# Patient Record
Sex: Female | Born: 1990 | Race: White | Hispanic: No | Marital: Married | Smoking: Never smoker
Health system: Southern US, Community
[De-identification: ages and names within clinical notes are randomized; demographics above are authoritative.]

---
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
## 2012-11-04 ENCOUNTER — Encounter (INDEPENDENT_AMBULATORY_CARE_PROVIDER_SITE_OTHER): Payer: Self-pay | Admitting: *Deleted

## 2012-11-12 ENCOUNTER — Ambulatory Visit (INDEPENDENT_AMBULATORY_CARE_PROVIDER_SITE_OTHER): Payer: 59 | Admitting: Internal Medicine

## 2013-06-25 ENCOUNTER — Other Ambulatory Visit: Payer: Self-pay | Admitting: Obstetrics & Gynecology

## 2013-06-25 MED ORDER — DESOGESTREL-ETHINYL ESTRADIOL 0.15-0.02/0.01 MG (21/5) PO TABS: 1.0000 | ORAL_TABLET | Freq: Every day | ORAL | Status: DC

## 2013-08-10 ENCOUNTER — Encounter (HOSPITAL_COMMUNITY): Payer: Self-pay | Admitting: Emergency Medicine

## 2013-08-10 ENCOUNTER — Emergency Department (HOSPITAL_COMMUNITY)
Admission: EM | Admit: 2013-08-10 | Discharge: 2013-08-10 | Disposition: A | Discharge status: Home / Self Care | Payer: 59 | Attending: Emergency Medicine | Admitting: Emergency Medicine

## 2013-08-10 DIAGNOSIS — Z3202 Encounter for pregnancy test, result negative: Secondary | ICD-10-CM | POA: Insufficient documentation

## 2013-08-10 DIAGNOSIS — Z79899 Other long term (current) drug therapy: Secondary | ICD-10-CM | POA: Insufficient documentation

## 2013-08-10 DIAGNOSIS — K5289 Other specified noninfective gastroenteritis and colitis: Secondary | ICD-10-CM | POA: Insufficient documentation

## 2013-08-10 DIAGNOSIS — K529 Noninfective gastroenteritis and colitis, unspecified: Secondary | ICD-10-CM

## 2013-08-10 LAB — COMPREHENSIVE METABOLIC PANEL
ALBUMIN: 4.1 g/dL (ref 3.5–5.2)
ALK PHOS: 55 U/L (ref 39–117)
ALT: 12 U/L (ref 0–35)
AST: 17 U/L (ref 0–37)
BILIRUBIN TOTAL: 0.4 mg/dL (ref 0.3–1.2)
BUN: 14 mg/dL (ref 6–23)
CHLORIDE: 99 meq/L (ref 96–112)
CO2: 24 mEq/L (ref 19–32)
Calcium: 9.2 mg/dL (ref 8.4–10.5)
Creatinine, Ser: 0.77 mg/dL (ref 0.50–1.10)
GFR calc Af Amer: >90 mL/min (ref >90)
GFR calc non Af Amer: >90 mL/min (ref >90)
Glucose, Bld: 111 mg/dL — ABNORMAL HIGH (ref 70–99)
POTASSIUM: 3.8 meq/L (ref 3.7–5.3)
Sodium: 136 mEq/L — ABNORMAL LOW (ref 137–147)
Total Protein: 8.1 g/dL (ref 6.0–8.3)

## 2013-08-10 LAB — URINALYSIS, ROUTINE W REFLEX MICROSCOPIC
Bilirubin Urine: NEGATIVE
Glucose, UA: NEGATIVE mg/dL
KETONES UR: 15 mg/dL — AB
Leukocytes, UA: NEGATIVE
Nitrite: NEGATIVE
PH: 6 (ref 5.0–8.0)
Specific Gravity, Urine: >1.03 — ABNORMAL HIGH (ref 1.005–1.030)
Urobilinogen, UA: 0.2 mg/dL (ref 0.0–1.0)

## 2013-08-10 LAB — CBC WITH DIFFERENTIAL/PLATELET
BASOS ABS: 0 10*3/uL (ref 0.0–0.1)
BASOS PCT: 0 % (ref 0–1)
Eosinophils Absolute: 0.1 10*3/uL (ref 0.0–0.7)
Eosinophils Relative: 1 % (ref 0–5)
HCT: 39.9 % (ref 36.0–46.0)
Hemoglobin: 13.6 g/dL (ref 12.0–15.0)
Lymphocytes Relative: 8 % — ABNORMAL LOW (ref 12–46)
Lymphs Abs: 0.8 10*3/uL (ref 0.7–4.0)
MCH: 29 pg (ref 26.0–34.0)
MCHC: 34.1 g/dL (ref 30.0–36.0)
MCV: 85.1 fL (ref 78.0–100.0)
Monocytes Absolute: 0.6 10*3/uL (ref 0.1–1.0)
Monocytes Relative: 6 % (ref 3–12)
NEUTROS PCT: 85 % — AB (ref 43–77)
Neutro Abs: 7.9 10*3/uL — ABNORMAL HIGH (ref 1.7–7.7)
Platelets: 246 10*3/uL (ref 150–400)
RBC: 4.69 MIL/uL (ref 3.87–5.11)
RDW: 12.9 % (ref 11.5–15.5)
WBC: 9.3 10*3/uL (ref 4.0–10.5)

## 2013-08-10 LAB — URINE MICROSCOPIC-ADD ON

## 2013-08-10 LAB — PREGNANCY, URINE: Preg Test, Ur: NEGATIVE

## 2013-08-10 MED ORDER — KETOROLAC TROMETHAMINE 30 MG/ML IJ SOLN
30.0000 mg | Freq: Once | INTRAMUSCULAR | Status: AC
  Administered 2013-08-10: 30 mg via INTRAVENOUS
  Filled 2013-08-10: qty 1

## 2013-08-10 MED ORDER — ONDANSETRON HCL 4 MG/2ML IJ SOLN
4.0000 mg | Freq: Once | INTRAMUSCULAR | Status: AC
  Administered 2013-08-10: 4 mg via INTRAVENOUS
  Filled 2013-08-10: qty 2

## 2013-08-10 MED ORDER — SODIUM CHLORIDE 0.9 % IV BOLUS (SEPSIS)
1000.0000 mL | Freq: Once | INTRAVENOUS | Status: AC
  Administered 2013-08-10: 1000 mL via INTRAVENOUS

## 2013-08-10 MED ORDER — ONDANSETRON 8 MG PO TBDP: ORAL_TABLET | ORAL | Status: DC

## 2013-08-10 NOTE — Discharge Instructions (Signed)
Zofran as needed for nausea.  Clear liquids as tolerated for the next 24 hours, then slowly advance your diet.  Return to the emergency department if he develops severe abdominal pain, bloody stool, or any other new and concerning symptoms.   Viral Gastroenteritis Viral gastroenteritis is also known as stomach flu. This condition affects the stomach and intestinal tract. It can cause sudden diarrhea and vomiting. The illness typically lasts 3 to 8 days. Most people develop an immune response that eventually gets rid of the virus. While this natural response develops, the virus can make you quite ill. CAUSES  Many different viruses can cause gastroenteritis, such as rotavirus or noroviruses. You can catch one of these viruses by consuming contaminated food or water. You may also catch a virus by sharing utensils or other personal items with an infected person or by touching a contaminated surface. SYMPTOMS  The most common symptoms are diarrhea and vomiting. These problems can cause a severe loss of body fluids (dehydration) and a body salt (electrolyte) imbalance. Other symptoms may include:  Fever.  Headache.  Fatigue.  Abdominal pain. DIAGNOSIS  Your caregiver can usually diagnose viral gastroenteritis based on your symptoms and a physical exam. A stool sample may also be taken to test for the presence of viruses or other infections. TREATMENT  This illness typically goes away on its own. Treatments are aimed at rehydration. The most serious cases of viral gastroenteritis involve vomiting so severely that you are not able to keep fluids down. In these cases, fluids must be given through an intravenous line (IV). HOME CARE INSTRUCTIONS   Drink enough fluids to keep your urine clear or pale yellow. Drink small amounts of fluids frequently and increase the amounts as tolerated.  Ask your caregiver for specific rehydration instructions.  Avoid:  Foods high in  sugar.  Alcohol.  Carbonated drinks.  Tobacco.  Juice.  Caffeine drinks.  Extremely hot or cold fluids.  Fatty, greasy foods.  Too much intake of anything at one time.  Dairy products until 24 to 48 hours after diarrhea stops.  You may consume probiotics. Probiotics are active cultures of beneficial bacteria. They may lessen the amount and number of diarrheal stools in adults. Probiotics can be found in yogurt with active cultures and in supplements.  Wash your hands well to avoid spreading the virus.  Only take over-the-counter or prescription medicines for pain, discomfort, or fever as directed by your caregiver. Do not give aspirin to children. Antidiarrheal medicines are not recommended.  Ask your caregiver if you should continue to take your regular prescribed and over-the-counter medicines.  Keep all follow-up appointments as directed by your caregiver. SEEK IMMEDIATE MEDICAL CARE IF:   You are unable to keep fluids down.  You do not urinate at least once every 6 to 8 hours.  You develop shortness of breath.  You notice blood in your stool or vomit. This may look like coffee grounds.  You have abdominal pain that increases or is concentrated in one small area (localized).  You have persistent vomiting or diarrhea.  You have a fever.  The patient is a child younger than 3 months, and he or she has a fever.  The patient is a child older than 3 months, and he or she has a fever and persistent symptoms.  The patient is a child older than 3 months, and he or she has a fever and symptoms suddenly get worse.  The patient is a baby, and he or  she has no tears when crying. MAKE SURE YOU:   Understand these instructions.  Will watch your condition.  Will get help right away if you are not doing well or get worse. Document Released: 03/06/2005 Document Revised: 05/29/2011 Document Reviewed: 12/21/2010 [facility] Patient Information 2014 Startup.

## 2013-08-10 NOTE — ED Notes (Signed)
Pt states she is having abd pains with vomiting and diarrhea since Friday

## 2013-08-10 NOTE — ED Provider Notes (Signed)
CSN: [id number]     Arrival date & time 08/10/13  0407 History   First MD Initiated Contact with Patient 08/10/13 [phone]     Chief Complaint  Patient presents with  . Abdominal Pain  . Emesis     (Consider location/radiation/quality/duration/timing/severity/associated sxs/prior Treatment) HPI Comments: Patient is a 23 year old female otherwise healthy who presents with complaints of nausea, vomiting, diarrhea, and intermittent abdominal cramping for the past 3 days. She states she is having hard time keeping fluids and solids down. She denies any bloody vomit or stool. She denies any burning with urination. She denies any fever. Her last menstrual period is current and denies the possibility of being pregnant.  Patient is a 23 y.o. female presenting with abdominal pain and vomiting. The history is provided by the patient.  Abdominal Pain Pain location:  Generalized Pain quality: cramping   Pain severity:  Moderate Onset quality:  Gradual Duration:  3 days Timing:  Intermittent Progression:  Worsening Chronicity:  New Relieved by:  Nothing Worsened by:  Nothing tried Ineffective treatments:  None tried Associated symptoms: vomiting   Emesis Associated symptoms: abdominal pain     History reviewed. No pertinent past medical history. History reviewed. No pertinent past surgical history. No family history on file. History  Substance Use Topics  . Smoking status: Never Smoker   . Smokeless tobacco: Not on file  . Alcohol Use: No   OB History   Grav Para Term Preterm Abortions TAB SAB Ect Mult Living                 Review of Systems  Gastrointestinal: Positive for vomiting and abdominal pain.  All other systems reviewed and are negative.     Allergies  Review of patient's allergies indicates no known allergies.  Home Medications   Prior to Admission medications   Medication Sig Start Date End Date Taking? Authorizing Provider  loperamide (IMODIUM) 1 MG/5ML solution  Take by mouth as needed for diarrhea or loose stools.   Yes Historical Provider, MD  desogestrel-ethinyl estradiol (KARIVA,AZURETTE,MIRCETTE) 0.15-0.02/0.01 MG (21/5) tablet Take 1 tablet by mouth daily. 06/25/13   Florian Buff, MD   BP 118/73  Pulse 70  Temp(Src) 97.9 F (36.6 C) (Oral)  Resp 20  Ht 5\' 3"  (1.6 m)  Wt 117 lb (53.071 kg)  BMI 20.73 kg/m2  SpO2 99%  LMP 08/07/2013 Physical Exam  Nursing note and vitals reviewed. Constitutional: She is oriented to person, place, and time. She appears well-developed and well-nourished. No distress.  HENT:  Head: Normocephalic and atraumatic.  Neck: Normal range of motion. Neck supple.  Cardiovascular: Normal rate and regular rhythm.  Exam reveals no gallop and no friction rub.   No murmur heard. Pulmonary/Chest: Effort normal and breath sounds normal. No respiratory distress. She has no wheezes.  Abdominal: Soft. Bowel sounds are normal. She exhibits no distension. There is tenderness.  There is mild generalized tenderness to palpation of the abdomen. There is no focal tenderness and no rebound and no guarding.  Musculoskeletal: Normal range of motion.  Neurological: She is alert and oriented to person, place, and time.  Skin: Skin is warm and dry. She is not diaphoretic.    ED Course  Procedures (including critical care time) Labs Review Labs Reviewed - No data to display  Imaging Review No results found.   EKG Interpretation None      MDM   Final diagnoses:  None    The patient's presentation, physical examination, and  workup are all consistent with a viral gastroenteritis. She is feeling better with IV fluids, Zofran, and Toradol. Her abdomen is benign. There is no white count to suggest severe infection and electrolytes are essentially unremarkable. She will be discharged with Zofran and instructions to return as needed if her symptoms substantially worsen or change.    Veryl Speak, MD 08/10/13 [phone]

## 2013-10-07 ENCOUNTER — Ambulatory Visit (INDEPENDENT_AMBULATORY_CARE_PROVIDER_SITE_OTHER): Payer: 59 | Admitting: Obstetrics & Gynecology

## 2013-10-07 ENCOUNTER — Encounter: Payer: Self-pay | Admitting: Obstetrics & Gynecology

## 2013-10-07 VITALS — BP 110/70 | Ht 63.0 in | Wt 118.4 lb

## 2013-10-07 DIAGNOSIS — Z309 Encounter for contraceptive management, unspecified: Secondary | ICD-10-CM

## 2013-10-22 ENCOUNTER — Encounter (INDEPENDENT_AMBULATORY_CARE_PROVIDER_SITE_OTHER): Payer: Self-pay | Admitting: *Deleted

## 2013-10-27 ENCOUNTER — Other Ambulatory Visit (INDEPENDENT_AMBULATORY_CARE_PROVIDER_SITE_OTHER): Payer: Self-pay | Admitting: Internal Medicine

## 2013-10-27 DIAGNOSIS — R160 Hepatomegaly, not elsewhere classified: Secondary | ICD-10-CM

## 2013-10-29 ENCOUNTER — Ambulatory Visit (HOSPITAL_COMMUNITY)
Admission: RE | Admit: 2013-10-29 | Discharge: 2013-10-29 | Disposition: A | Discharge status: Home / Self Care | Payer: 59 | Source: Ambulatory Visit | Attending: Internal Medicine | Admitting: Internal Medicine

## 2013-10-29 DIAGNOSIS — R160 Hepatomegaly, not elsewhere classified: Secondary | ICD-10-CM | POA: Diagnosis not present

## 2013-12-16 NOTE — Progress Notes (Signed)
Patient ID: Gina Anderson, female   DOB: 19-Dec-1990, 23 y.o.   MRN: [id number] Having difficulty with this ocp with her menses Will change to a 30 mic desogestrel ocp Will follow up if having a problem

## 2014-02-20 ENCOUNTER — Ambulatory Visit (HOSPITAL_COMMUNITY)
Admission: RE | Admit: 2014-02-20 | Discharge: 2014-02-20 | Disposition: A | Discharge status: Home / Self Care | Payer: 59 | Source: Ambulatory Visit | Attending: Internal Medicine | Admitting: Internal Medicine

## 2014-02-20 DIAGNOSIS — R1013 Epigastric pain: Secondary | ICD-10-CM | POA: Diagnosis not present

## 2014-02-20 DIAGNOSIS — R933 Abnormal findings on diagnostic imaging of other parts of digestive tract: Secondary | ICD-10-CM | POA: Diagnosis not present

## 2014-02-20 DIAGNOSIS — R109 Unspecified abdominal pain: Secondary | ICD-10-CM

## 2014-02-20 LAB — CBC
HCT: 38.7 % (ref 36.0–46.0)
HEMOGLOBIN: 13.6 g/dL (ref 12.0–15.0)
MCH: 29.6 pg (ref 26.0–34.0)
MCHC: 35.1 g/dL (ref 30.0–36.0)
MCV: 84.3 fL (ref 78.0–100.0)
Platelets: 198 10*3/uL (ref 150–400)
RBC: 4.59 MIL/uL (ref 3.87–5.11)
RDW: 12.7 % (ref 11.5–15.5)
WBC: 13.1 10*3/uL — ABNORMAL HIGH (ref 4.0–10.5)

## 2014-02-20 LAB — DIFFERENTIAL
BASOS PCT: 0 % (ref 0–1)
Basophils Absolute: 0 10*3/uL (ref 0.0–0.1)
Eosinophils Absolute: 0.1 10*3/uL (ref 0.0–0.7)
Eosinophils Relative: 1 % (ref 0–5)
Lymphocytes Relative: 10 % — ABNORMAL LOW (ref 12–46)
Lymphs Abs: 1.3 10*3/uL (ref 0.7–4.0)
MONOS PCT: 4 % (ref 3–12)
Monocytes Absolute: 0.5 10*3/uL (ref 0.1–1.0)
NEUTROS ABS: 11.2 10*3/uL — AB (ref 1.7–7.7)
Neutrophils Relative %: 85 % — ABNORMAL HIGH (ref 43–77)

## 2014-02-20 LAB — HEPATIC FUNCTION PANEL
ALT: 13 U/L (ref 0–35)
AST: 16 U/L (ref 0–37)
Albumin: 4.6 g/dL (ref 3.5–5.2)
Alkaline Phosphatase: 42 U/L (ref 39–117)
Bilirubin, Direct: <0.2 mg/dL (ref 0.0–0.3)
TOTAL PROTEIN: 8.5 g/dL — AB (ref 6.0–8.3)
Total Bilirubin: 0.2 mg/dL — ABNORMAL LOW (ref 0.3–1.2)

## 2014-02-20 LAB — AMYLASE: Amylase: 55 U/L (ref 0–105)

## 2014-02-20 MED ORDER — IOHEXOL 300 MG/ML  SOLN
100.0000 mL | Freq: Once | INTRAMUSCULAR | Status: AC | PRN
  Administered 2014-02-20: 100 mL via INTRAVENOUS

## 2014-02-20 NOTE — Progress Notes (Signed)
Presenting complaint; Severe epigastric and pain across lower chest.  Subjective; Gina Anderson is 23 year old Caucasian female whom I have seen in the past for abdominal and mild splenomegaly. Her mother who works in [place] requested me to see her. Patient has been seen in emergency room on 2 prior occasions and workup is negative. She was fine when she went to bed last week. She woke up around 7 AM with pain across her upper abdomen. Everson pain has been getting worse. She describes his pain as a constant pain but it increases in intensity and may last for a few minutes. She has noted nausea but no vomiting. She denies shortness breath or cough. She has felt warm. She denies diarrhea melena or rectal bleeding. She also does not have urinary symptoms. She took one dose of Aleve this morning but without symptomatic improvement. Between these spells she has good appetite and she denies weight loss. She denies heartburn. Objective Patient appears to be in pain. Temp is 99.6. Face is flushed. Conjunctiva was pink. Sclerae nonicteric. No neck masses or thyromegaly noted. Cardiac exam with regular rhythm normal S1 and S2. No murmur or gallop noted. Lungs are clear to auscultation. Abdomen is symmetrical bowel sounds are normal. On palpation abdomen is soft. She has mild midepigastric tenderness below xiphisternum. No organomegaly or masses.  Assessment; Acute onset of bilateral lower chest and epigastric pain. She has had 5 or 6 episodes previously and workup is negative. Examination is unremarkable except low-grade fever and epigastric tenderness. Prior ultrasound exams have been negative for cholelithiasis. Need to rule out pancreatitis as well as mediastinal disease or adenopathy. Symptoms are not typical of GERD.  Plan; CBC, LFTs and serum amylase. Chest and abdominopelvic CT with contrast while she is having symptoms as the yield may be higher than menses asymptomatic. Percocet 5/325 one tablet by  mouth every 4 when necessary. Patient advised to go to emergency room if pain is not controlled with Percocet or if she develops vomiting or fever greater than 101.

## 2014-02-23 ENCOUNTER — Telehealth (INDEPENDENT_AMBULATORY_CARE_PROVIDER_SITE_OTHER): Payer: Self-pay | Admitting: *Deleted

## 2014-02-23 NOTE — Telephone Encounter (Signed)
Lavella Lemons, mother, would like to speak with Dr. Laural Golden. The return phone number is (work) [phone] or (cell) [phone]. Mother refused to say what the call was about. When asked for the return phone number, mother was very disrespectful. Ulani had a CT done on 02/20/14.

## 2014-02-24 NOTE — Telephone Encounter (Signed)
Dr.Rehman has talked with the patient , and mother. He has also talked with Dr.Daniel. Per Dr.Rehman he will be tagging information to be sent to [place]. Forwarded to [employer] as Juluis Rainier.

## 2014-02-24 NOTE — Telephone Encounter (Signed)
Notes faxed to Dr Quillian Quince

## 2014-02-25 ENCOUNTER — Ambulatory Visit (HOSPITAL_COMMUNITY)
Admission: RE | Admit: 2014-02-25 | Discharge: 2014-02-25 | Disposition: A | Discharge status: Home / Self Care | Payer: 59 | Source: Ambulatory Visit | Attending: Family Medicine | Admitting: Family Medicine

## 2014-02-25 DIAGNOSIS — I313 Pericardial effusion (noninflammatory): Secondary | ICD-10-CM | POA: Insufficient documentation

## 2014-02-25 DIAGNOSIS — I319 Disease of pericardium, unspecified: Secondary | ICD-10-CM

## 2014-02-25 NOTE — Progress Notes (Signed)
  Echocardiogram 2D Echocardiogram has been performed.  Darlina Sicilian M 02/25/2014, 1:27 PM

## 2014-04-16 ENCOUNTER — Ambulatory Visit (INDEPENDENT_AMBULATORY_CARE_PROVIDER_SITE_OTHER): Payer: 59 | Admitting: Cardiology

## 2014-04-16 ENCOUNTER — Encounter: Payer: Self-pay | Admitting: Cardiology

## 2014-04-16 VITALS — BP 126/84 | HR 84 | Ht 63.0 in | Wt 121.4 lb

## 2014-04-16 DIAGNOSIS — R079 Chest pain, unspecified: Secondary | ICD-10-CM

## 2014-04-16 DIAGNOSIS — R1013 Epigastric pain: Secondary | ICD-10-CM

## 2014-04-16 NOTE — Progress Notes (Signed)
Cardiology Office Note   Date:  04/16/2014   ID:  Gina Anderson, DOB February 10, 1991, MRN [id number]  PCP:  Gar Ponto, MD  Cardiologist:   Darlin Coco, MD   No chief complaint on file.     History of Present Illness: Gina Anderson is a 24 y.o. female who presents for evaluation of recent epigastric and chest discomfort.  The patient is a Ship broker at [employer].  She has a history of intermittent upper abdominal discomfort which has occurred off and on for several years.  She has been followed for this by Dr. Quillian Quince and has also seen Dr. Laural Golden, a gastroenterologist in [place].  On December 3 she had another episode of sudden severe upper abdominal discomfort.  An abdominal CT scan showed a slight pericardial effusion.  She had a subsequent echocardiogram on 02/25/14 which did not show any significant pericardial effusion.  There was a questionable scant effusion.  There was no evidence of hemodynamic compromise.  There were no valve lesions.  Since then the patient has been feeling well.  The patient's mother and sister were also present during today's exam.  They point out that normally the episodes of unexplained abdominal discomfort occur on weekends or on vacations. Blood work was obtained at the time of her initial presentation on December 3.  She had an initially slightly elevated white count which was normal on repeat.  She did not have a sedimentation rate or C-reactive protein drawn.  She comes in now for further follow-up of the questionable pericardial effusion.  She feels well now with no symptoms of chest discomfort or abdominal discomfort or shortness of breath.  He does not recall any pleuritic component to the previous epigastric discomfort.  Did have a low-grade temperature.    History reviewed. No pertinent past medical history.  History reviewed. No pertinent past surgical history.   Current Outpatient Prescriptions  Medication Sig Dispense Refill    . desogestrel-ethinyl estradiol (KARIVA,AZURETTE,MIRCETTE) 0.15-0.02/0.01 MG (21/5) tablet Take 1 tablet by mouth daily. 1 Package 11   No current facility-administered medications for this visit.    Allergies:   Review of patient's allergies indicates no known allergies.    Social History:  The patient  reports that she has never smoked. She does not have any smokeless tobacco history on file. She reports that she does not drink alcohol or use illicit drugs.   Family History:  The patient's family history includes Diabetes in her paternal grandfather and paternal grandmother; Heart Problems in her paternal grandfather; Lung cancer in her maternal grandfather.    ROS:  Please see the history of present illness.   Otherwise, review of systems are positive for none.   All other systems are reviewed and negative.    PHYSICAL EXAM: VS:  BP 126/84 mmHg  Pulse 84  Ht 5\' 3"  (1.6 m)  Wt 121 lb 6.4 oz (55.067 kg)  BMI 21.51 kg/m2 , BMI Body mass index is 21.51 kg/(m^2). GEN: Well nourished, well developed, in no acute distress HEENT: normal Neck: no JVD, carotid bruits, or masses Cardiac: RRR; no murmurs, rubs, or gallops,no edema  Respiratory:  clear to auscultation bilaterally, normal work of breathing GI: soft, nontender, nondistended, + BS MS: no deformity or atrophy Skin: warm and dry, no rash Neuro:  Strength and sensation are intact Psych: euthymic mood, full affect   EKG:  EKG is ordered today. The ekg ordered today demonstrates normal sinus rhythm.  Borderline rightward axis.  No ST segment changes to suggest pericarditis.   Recent Labs: 08/10/2013: BUN 14; Creatinine 0.77; Potassium 3.8; Sodium 136* 02/20/2014: ALT 13; Hemoglobin 13.6; Platelets 198    Lipid Panel No results found for: CHOL, TRIG, HDL, CHOLHDL, VLDL, LDLCALC, LDLDIRECT    Wt Readings from Last 3 Encounters:  04/16/14 121 lb 6.4 oz (55.067 kg)  10/07/13 118 lb 6.4 oz (53.706 kg)  08/10/13 117 lb  (53.071 kg)      Other studies Reviewed: Additional studies/ records that were reviewed today include: Results of recent CT scan of chest and abdomen.    ASSESSMENT AND PLAN:  1.  History of intermittent severe upper abdominal discomfort occurring off and on over the past several years, followed by GI and by Dr. Quillian Quince. 2.  Possible recent acute pericarditis in association with possible viral illness on December 3.  This was manifested by a tiny pericardial effusion.  Apparently no pericardial rub was heard by examining physicians.  Her initial white count was mildly elevated but this is a nonspecific finding.  No acute phase reactants such as sedimentation rate or C-reactive protein are available.  Echocardiogram on December 9 showed scant if any pericardial fluid.  Disposition: No further treatment indicated at this point.  If she has a recurrent of these symptoms we would like to examine her and listening closely for rub and also obtain acute phase reactants.   Current medicines are reviewed at length with the patient today.  The patient does not have concerns regarding medicines.  The following changes have been made:  no change  Labs/ tests ordered today include: 1   Orders Placed This Encounter  Procedures  . EKG 12-Lead     Disposition:   Recheck on a when necessary basis.  Many thanks for the opportunity to see this pleasant woman with you.   Signed, Darlin Coco, MD  04/16/2014 6:17 PM    [facility] [address], [place], [place]  [address] Phone: [phone]; Fax: [phone]

## 2014-04-16 NOTE — Patient Instructions (Signed)
Your physician recommends that you continue on your current medications as directed. Please refer to the Current Medication list given to you today.  Follow up as needed  

## 2014-04-29 ENCOUNTER — Other Ambulatory Visit: Payer: Self-pay | Admitting: Obstetrics & Gynecology

## 2015-04-26 ENCOUNTER — Other Ambulatory Visit: Payer: Self-pay | Admitting: Obstetrics & Gynecology

## 2015-09-04 IMAGING — CT CT ABD-PELV W/ CM
2 of 4 series · 13 of 36 positions shown, 16 images · IV contrast (Omnipaque 300)
Comparison: Abdominal ultrasound performed 10/29/2013

CLINICAL DATA: Acute onset of epigastric abdominal pain, radiating
from the right to the left, for the past day. Initial encounter.

EXAM:
CT CHEST, ABDOMEN, AND PELVIS WITH CONTRAST
TECHNIQUE: Multidetector CT imaging of the chest, abdomen and pelvis was
performed following the standard protocol during bolus
administration of intravenous contrast.
CONTRAST:  100mL OMNIPAQUE IOHEXOL 300 MG/ML  SOLN

[Series 3: cap with 5.0 b40f · axial · 0.74mm/px · z∈[+399,+964]mm · 10 of 129 slices shown, 13 images]
[im 8/129  mediastinal]
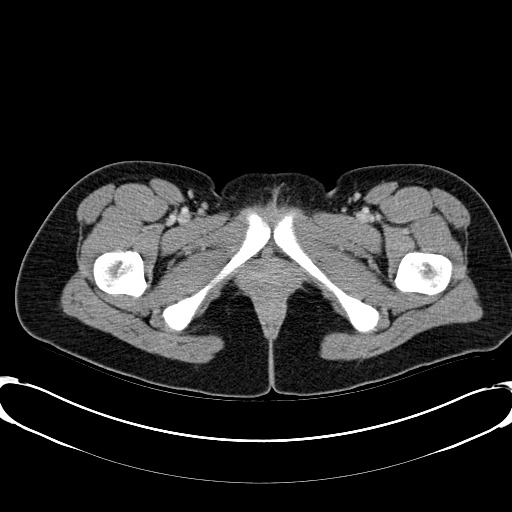
[im 8/129  lung]
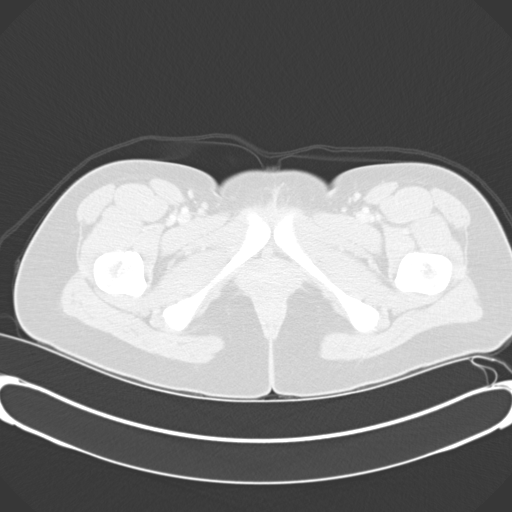
[im 22/129  lung]
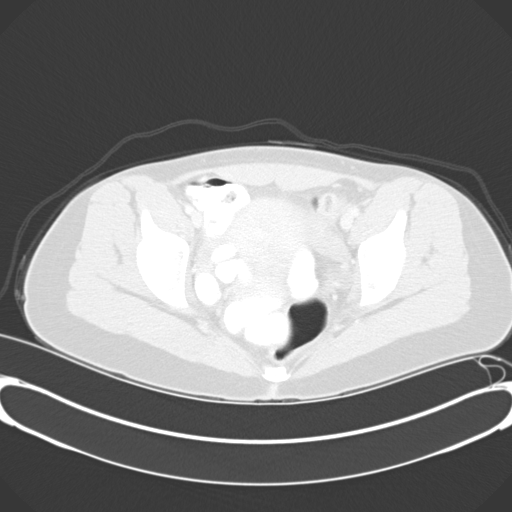
[im 36/129  lung]
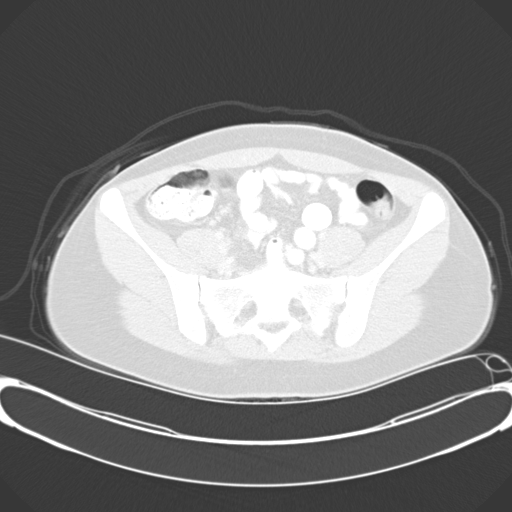
[im 43/129  lung]
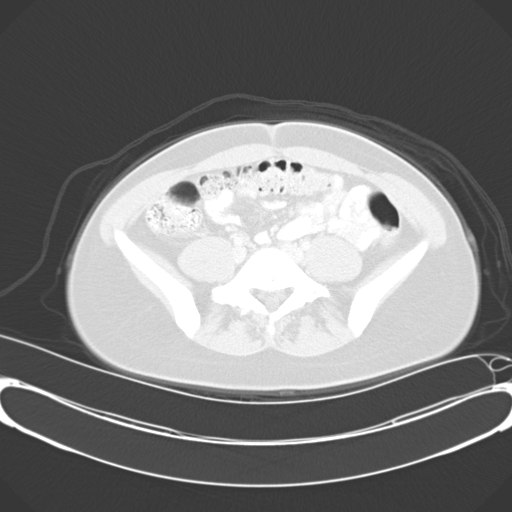
[im 57/129  mediastinal]
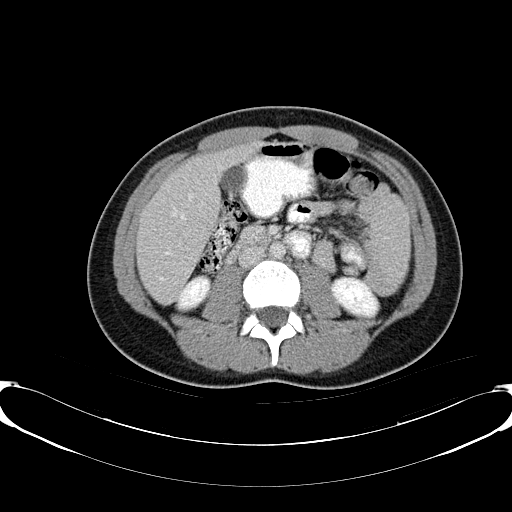
[im 57/129  lung]
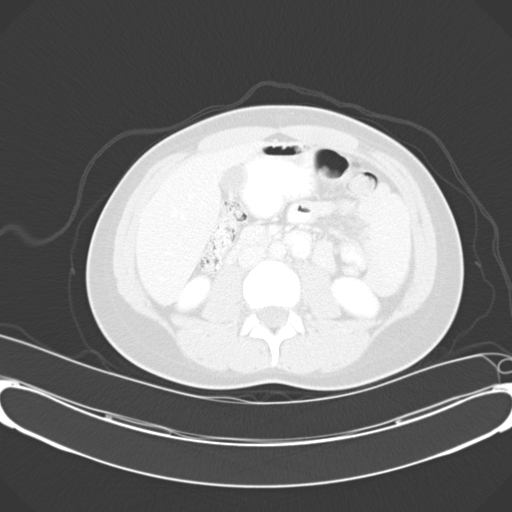
[im 72/129  lung]
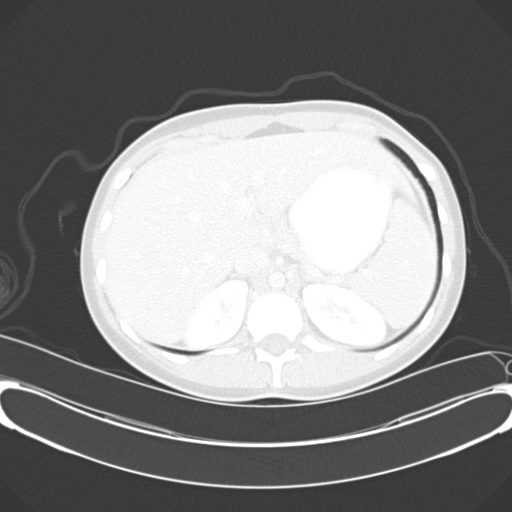
[im 86/129  lung]
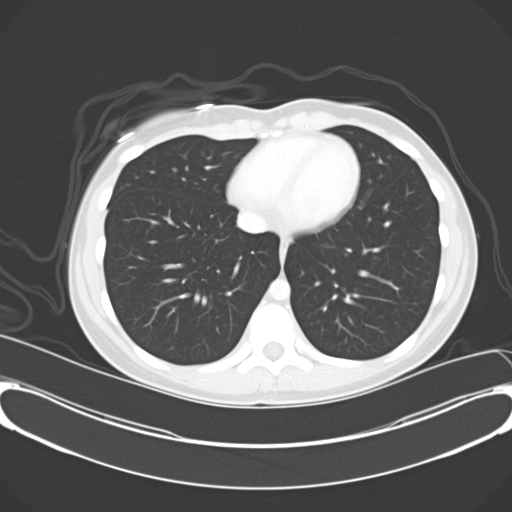
[im 93/129  lung]
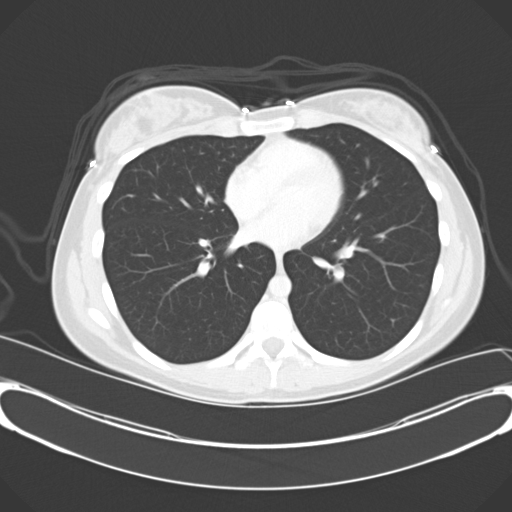
[im 107/129  mediastinal]
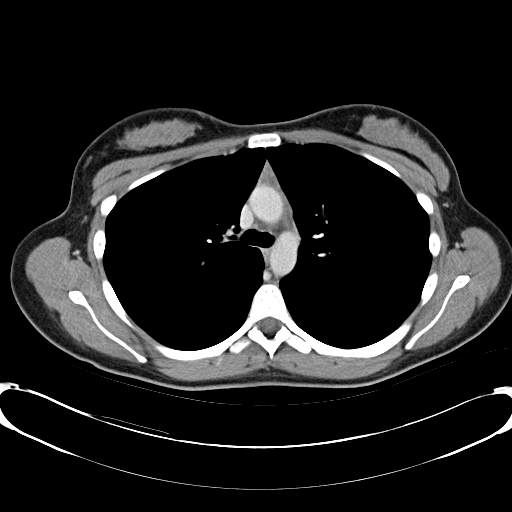
[im 107/129  lung]
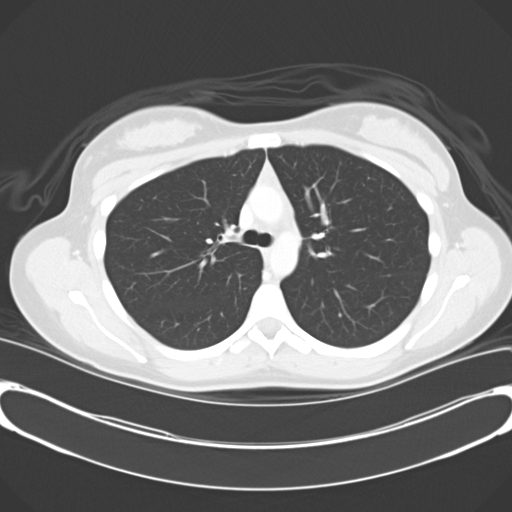
[im 121/129  lung]
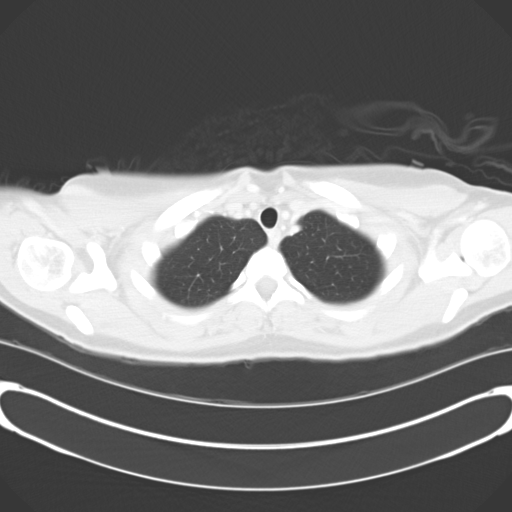

[Series 5: mpr cor post contrast 3.0mm · coronal · 0.74mm/px · 3 of 75 slices shown]
[im 15/75  lung]
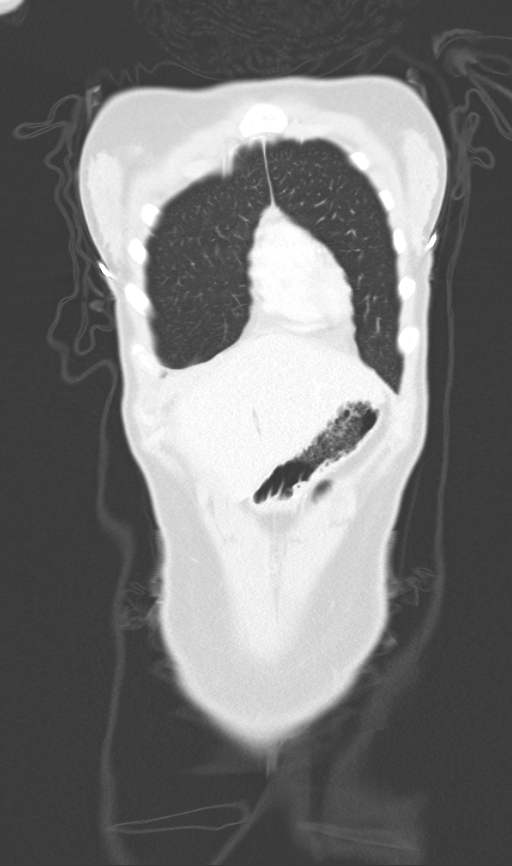
[im 30/75  lung]
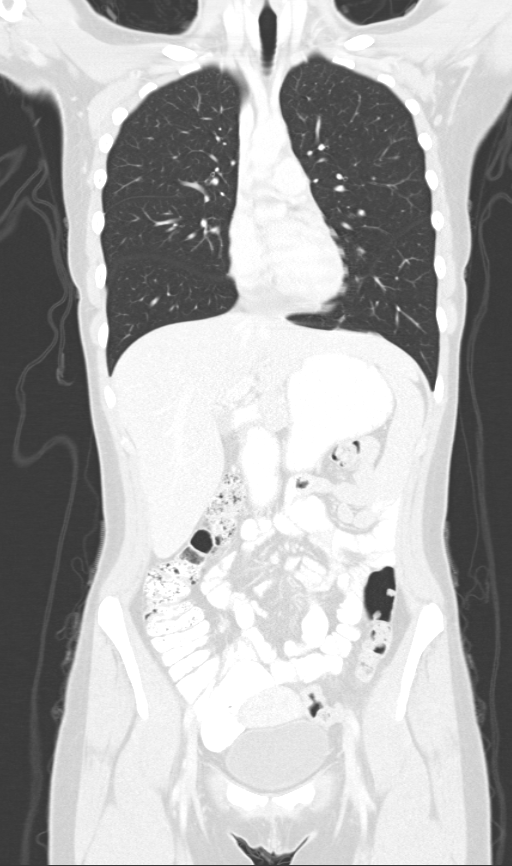
[im 45/75  lung]
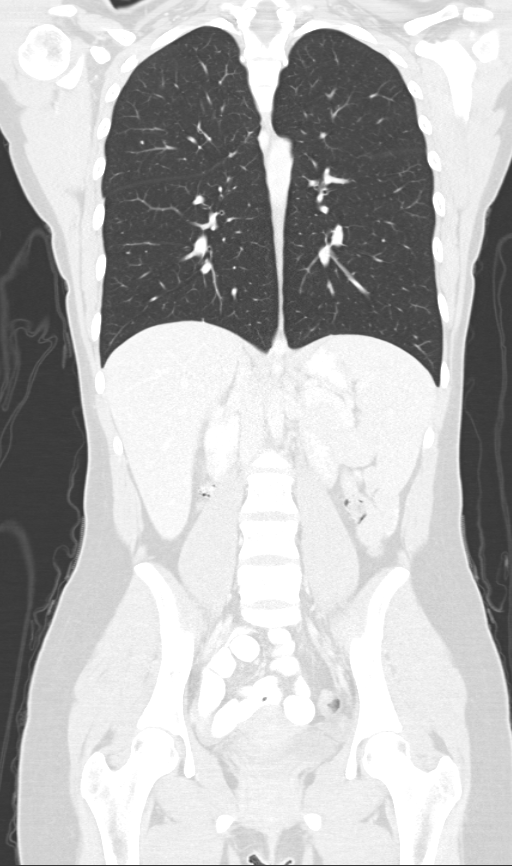

[13 of 36 positions shown; findings below may reference images not displayed]

FINDINGS: CT CHEST FINDINGS

The lungs are clear bilaterally. No focal consolidation, pleural
effusion or pneumothorax is seen. No masses are identified.

Note is made of a trace pericardial effusion at the inferior aspect
of the mediastinum. There may be slight peripheral enhancement.
Though this could still remain within normal limits, would correlate
clinically to exclude pericarditis.

The mediastinum is otherwise unremarkable in appearance. No
mediastinal lymphadenopathy is seen. The great vessels are grossly
unremarkable in appearance. Incidental note is made of a direct
origin of the left vertebral artery from the aortic arch. Residual
thymic tissue is within normal limits. The thyroid gland is
unremarkable. No axillary lymphadenopathy is seen.

No acute osseous abnormalities are identified.

CT ABDOMEN AND PELVIS FINDINGS

The liver and spleen are unremarkable in appearance. The gallbladder
is within normal limits. The pancreas and adrenal glands are
unremarkable.

The kidneys are unremarkable in appearance. There is no evidence of
hydronephrosis. No renal or ureteral stones are seen. No perinephric
stranding is appreciated.

No free fluid is identified. The small bowel is unremarkable in
appearance. The stomach is within normal limits. No acute vascular
abnormalities are seen.

The appendix is somewhat dilated, measuring up to 1.0 cm, with
suggestion of peripheral enhancement. No contrast is seen within the
appendix, though contrast fills the cecum. This raises question for
mild appendicitis, though no significant associated soft tissue
inflammation is seen.

Contrast progresses to the level of the ascending colon. The colon
is unremarkable in appearance.

The bladder is mildly distended and grossly unremarkable. The uterus
is within normal limits. The ovaries are relatively symmetric. No
suspicious adnexal masses are seen. No inguinal lymphadenopathy is
seen.

No acute osseous abnormalities are identified.
IMPRESSION: 1. Note made of a trace pericardial effusion at the inferior aspect
of the mediastinum. There may be slight associated peripheral
enhancement. Though this could still remain within normal limits,
would correlate clinically to exclude pericarditis.
2. Appendiceal dilatation to 1.0 cm, with suggestion of peripheral
enhancement. Contrast does not fill the appendix. This raises
question for mild appendicitis, though no associated soft tissue
inflammation is seen. Depending on the patient's symptoms, this
could simply reflect the patient's baseline.

## 2016-03-31 ENCOUNTER — Other Ambulatory Visit: Payer: Self-pay | Admitting: Obstetrics & Gynecology

## 2016-06-10 ENCOUNTER — Encounter (HOSPITAL_COMMUNITY): Payer: Self-pay | Admitting: *Deleted

## 2016-06-10 ENCOUNTER — Emergency Department (HOSPITAL_COMMUNITY)
Admission: EM | Admit: 2016-06-10 | Discharge: 2016-06-10 | Disposition: A | Discharge status: Home / Self Care | Payer: Commercial Managed Care - HMO | Attending: Emergency Medicine | Admitting: Emergency Medicine

## 2016-06-10 DIAGNOSIS — N939 Abnormal uterine and vaginal bleeding, unspecified: Secondary | ICD-10-CM | POA: Diagnosis not present

## 2016-06-10 DIAGNOSIS — R1012 Left upper quadrant pain: Secondary | ICD-10-CM | POA: Insufficient documentation

## 2016-06-10 DIAGNOSIS — M549 Dorsalgia, unspecified: Secondary | ICD-10-CM | POA: Diagnosis not present

## 2016-06-10 DIAGNOSIS — R197 Diarrhea, unspecified: Secondary | ICD-10-CM | POA: Insufficient documentation

## 2016-06-10 DIAGNOSIS — R1011 Right upper quadrant pain: Secondary | ICD-10-CM | POA: Insufficient documentation

## 2016-06-10 DIAGNOSIS — R112 Nausea with vomiting, unspecified: Secondary | ICD-10-CM | POA: Insufficient documentation

## 2016-06-10 DIAGNOSIS — R1010 Upper abdominal pain, unspecified: Secondary | ICD-10-CM

## 2016-06-10 DIAGNOSIS — R1013 Epigastric pain: Secondary | ICD-10-CM | POA: Diagnosis not present

## 2016-06-10 LAB — COMPREHENSIVE METABOLIC PANEL
ALK PHOS: 44 U/L (ref 38–126)
ALT: 20 U/L (ref 14–54)
AST: 19 U/L (ref 15–41)
Albumin: 4.3 g/dL (ref 3.5–5.0)
Anion gap: 7 (ref 5–15)
BILIRUBIN TOTAL: 0.6 mg/dL (ref 0.3–1.2)
BUN: 12 mg/dL (ref 6–20)
CO2: 26 mmol/L (ref 22–32)
CREATININE: 0.71 mg/dL (ref 0.44–1.00)
Calcium: 9.1 mg/dL (ref 8.9–10.3)
Chloride: 104 mmol/L (ref 101–111)
Glucose, Bld: 99 mg/dL (ref 65–99)
Potassium: 3.6 mmol/L (ref 3.5–5.1)
Sodium: 137 mmol/L (ref 135–145)
Total Protein: 7.9 g/dL (ref 6.5–8.1)

## 2016-06-10 LAB — CBC WITH DIFFERENTIAL/PLATELET
Basophils Absolute: 0 10*3/uL (ref 0.0–0.1)
Basophils Relative: 0 %
EOS ABS: 0.3 10*3/uL (ref 0.0–0.7)
Eosinophils Relative: 3 %
HCT: 42.5 % (ref 36.0–46.0)
HEMOGLOBIN: 14.3 g/dL (ref 12.0–15.0)
LYMPHS ABS: 0.9 10*3/uL (ref 0.7–4.0)
Lymphocytes Relative: 10 %
MCH: 28.7 pg (ref 26.0–34.0)
MCHC: 33.6 g/dL (ref 30.0–36.0)
MCV: 85.2 fL (ref 78.0–100.0)
MONOS PCT: 5 %
Monocytes Absolute: 0.5 10*3/uL (ref 0.1–1.0)
NEUTROS PCT: 82 %
Neutro Abs: 7.4 10*3/uL (ref 1.7–7.7)
Platelets: 232 10*3/uL (ref 150–400)
RBC: 4.99 MIL/uL (ref 3.87–5.11)
RDW: 13 % (ref 11.5–15.5)
WBC: 9.1 10*3/uL (ref 4.0–10.5)

## 2016-06-10 LAB — URINALYSIS, ROUTINE W REFLEX MICROSCOPIC
BACTERIA UA: NONE SEEN
BILIRUBIN URINE: NEGATIVE
Glucose, UA: NEGATIVE mg/dL
KETONES UR: NEGATIVE mg/dL
LEUKOCYTES UA: NEGATIVE
NITRITE: NEGATIVE
Protein, ur: 30 mg/dL — AB
SPECIFIC GRAVITY, URINE: 1.029 (ref 1.005–1.030)
WBC UA: NONE SEEN WBC/hpf (ref 0–5)
pH: 5 (ref 5.0–8.0)

## 2016-06-10 LAB — LIPASE, BLOOD: LIPASE: 19 U/L (ref 11–51)

## 2016-06-10 LAB — POC URINE PREG, ED: Preg Test, Ur: NEGATIVE

## 2016-06-10 MED ORDER — ONDANSETRON HCL 4 MG PO TABS: 4.0000 mg | ORAL_TABLET | Freq: Three times a day (TID) | ORAL | 0 refills | Status: DC | PRN

## 2016-06-10 MED ORDER — ONDANSETRON HCL 4 MG/2ML IJ SOLN
4.0000 mg | Freq: Once | INTRAMUSCULAR | Status: AC
  Administered 2016-06-10: 4 mg via INTRAVENOUS
  Filled 2016-06-10: qty 2

## 2016-06-10 MED ORDER — KETOROLAC TROMETHAMINE 30 MG/ML IJ SOLN
30.0000 mg | Freq: Once | INTRAMUSCULAR | Status: AC
  Administered 2016-06-10: 30 mg via INTRAVENOUS
  Filled 2016-06-10: qty 1

## 2016-06-10 MED ORDER — SODIUM CHLORIDE 0.9 % IV BOLUS (SEPSIS)
1000.0000 mL | Freq: Once | INTRAVENOUS | Status: AC
  Administered 2016-06-10: 1000 mL via INTRAVENOUS

## 2016-06-10 NOTE — ED Triage Notes (Signed)
Pt c/o n/v/d that started yesterday, bilateral epigastric pain that started during the night,

## 2016-06-10 NOTE — ED Notes (Signed)
Pt ambulated to [facility]

## 2016-06-10 NOTE — ED Provider Notes (Signed)
[facility] DEPT Provider Note   CSN: [id number] Arrival date & time: 06/10/16  0548     History   Chief Complaint Chief Complaint  Patient presents with  . Abdominal Pain    HPI Gina Anderson is a 26 y.o. female.  HPI  27 year old female presents with a chief complaint of upper abdominal pain. She is accompanied by her mother. Patient had nausea, vomiting, and diarrhea yesterday afternoon. Spontaneously resolved. No abdominal pain at that time. Went to bed feeling well and then woke up at around 2 AM with upper abdominal pain. She has had this pain multiple times before. Has been seen by gastroenterology and cardiology. At one point she had a CT scan of her chest, abdomen and pelvis that showed a trace pericardial effusion and so it was thought that it might be pericarditis. Typically when she comes in the ER with this pain she is given Toradol and fluids and feels much better. Pain feels like a dull sensation. Is also in her upper back. No urinary symptoms. No current nausea or vomiting or diarrhea. Felt like she had a low-grade fever. There is no chest pain, cough, or shortness of breath. Has not been ill recently. Currently on her menstrual cycle. No vaginal discharge.  History reviewed. No pertinent past medical history.  Patient Active Problem List   Diagnosis Date Noted  . Epigastric pain 04/16/2014  . Chest pain 04/16/2014    History reviewed. No pertinent surgical history.  OB History    No data available       Home Medications    Prior to Admission medications   Medication Sig Start Date End Date Taking? Authorizing Provider  VIORELE 0.15-0.02/0.01 MG (21/5) tablet TAKE 1 TABLET BY MOUTH EVERY DAY 03/31/16  Yes Florian Buff, MD  ondansetron (ZOFRAN) 4 MG tablet Take 1 tablet (4 mg total) by mouth every 8 (eight) hours as needed for nausea or vomiting. 06/10/16   Sherwood Gambler, MD    Family History Family History  Problem Relation Age of Onset  . Diabetes  Paternal Grandfather   . Heart Problems Paternal Grandfather   . Diabetes Paternal Grandmother   . Lung cancer Maternal Grandfather     Social History Social History  Substance Use Topics  . Smoking status: Never Smoker  . Smokeless tobacco: Never Used  . Alcohol use No     Allergies   Patient has no known allergies.   Review of Systems Review of Systems  Constitutional: Negative for fever.  Respiratory: Negative for cough and shortness of breath.   Cardiovascular: Negative for chest pain.  Gastrointestinal: Positive for abdominal pain, diarrhea, nausea and vomiting.  Genitourinary: Positive for vaginal bleeding. Negative for dysuria.  Musculoskeletal: Positive for back pain.  All other systems reviewed and are negative.    Physical Exam Updated Vital Signs BP 110/67   Pulse 79   Temp 99.1 F (37.3 C) (Oral)   Resp 15   Ht 5\' 3"  (1.6 m)   Wt 113 lb (51.3 kg)   LMP 06/04/2016   SpO2 99%   BMI 20.02 kg/m   Physical Exam  Constitutional: She is oriented to person, place, and time. She appears well-developed and well-nourished. No distress.  HENT:  Head: Normocephalic and atraumatic.  Right Ear: External ear normal.  Left Ear: External ear normal.  Nose: Nose normal.  Mouth/Throat: Oropharynx is clear and moist.  Eyes: Right eye exhibits no discharge. Left eye exhibits no discharge.  Cardiovascular: Normal rate,  regular rhythm and normal heart sounds.  Exam reveals no friction rub.   No murmur heard. Pulmonary/Chest: Effort normal and breath sounds normal. She exhibits no tenderness.  Abdominal: Soft. There is tenderness (mild) in the right upper quadrant, epigastric area and left upper quadrant. There is negative Murphy's sign.  Neurological: She is alert and oriented to person, place, and time.  Skin: Skin is warm and dry. She is not diaphoretic.  Nursing note and vitals reviewed.    ED Treatments / Results  Labs (all labs ordered are listed, but only  abnormal results are displayed) Labs Reviewed  URINALYSIS, ROUTINE W REFLEX MICROSCOPIC - Abnormal; Notable for the following:       Result Value   APPearance CLOUDY (*)    Hgb urine dipstick LARGE (*)    Protein, ur 30 (*)    Squamous Epithelial / LPF 0-5 (*)    All other components within normal limits  CBC WITH DIFFERENTIAL/PLATELET  COMPREHENSIVE METABOLIC PANEL  LIPASE, BLOOD  POC URINE PREG, ED    EKG  EKG Interpretation  Date/Time:  Saturday June 10 2016 06:32:55 EDT Ventricular Rate:  69 PR Interval:    QRS Duration: 93 QT Interval:  407 QTC Calculation: 436 R Axis:   89 Text Interpretation:  Normal sinus rhythm RSR' in V1 or V2, right VCD or RVH No old tracing to compare Confirmed by Caydence Enck MD, Velita Quirk [phone]) on 06/10/2016 6:43:53 AM       Radiology No results found.  Procedures Procedures (including critical care time)    EMERGENCY DEPARTMENT [place] CARDIAC EXAM "Study: Limited Ultrasound of the Heart and Pericardium"  INDICATIONS:possible pericardial effusion, hx of pericarditis Multiple views of the heart and pericardium were obtained in real-time with a multi-frequency probe.  PERFORMED BS:JGGEZM IMAGES ARCHIVED?: Yes LIMITATIONS:  Body habitus VIEWS USED: Subcostal 4 chamber INTERPRETATION: Cardiac activity present, Pericardial effusioin absent, Cardiac tamponade absent and Normal contractility   Medications Ordered in ED Medications  sodium chloride 0.9 % bolus 1,000 mL (0 mLs Intravenous Stopped 06/10/16 0811)  ondansetron (ZOFRAN) injection 4 mg (4 mg Intravenous Given 06/10/16 0700)  ketorolac (TORADOL) 30 MG/ML injection 30 mg (30 mg Intravenous Given 06/10/16 0701)     Initial Impression / Assessment and Plan / ED Course  I have reviewed the triage vital signs and the nursing notes.  Pertinent labs & imaging results that were available during my care of the patient were reviewed by me and considered in my medical decision making (see chart for  details).     Patient's pain is significantly improved with toradol. Chart review shows her CT showed trace pericardial effusion. Based on ECG and history, my suspicion for pericarditis is low. Unclear cause, but as this is same as multiple priors, and labs and exam are benign, I think she can be discharged to f/u with PCP. No indication for acute imaging. Has had extensive workup. While it does not occur during every menstrual cycle, it is noted that she always has her cycle when this is occurring, why is unclear. Stable for d/c.  Final Clinical Impressions(s) / ED Diagnoses   Final diagnoses:  Upper abdominal pain    New Prescriptions Discharge Medication List as of 06/10/2016  7:52 AM       Sherwood Gambler, MD 06/10/16 1545

## 2017-01-08 DIAGNOSIS — M545 Low back pain: Secondary | ICD-10-CM | POA: Diagnosis not present

## 2017-01-08 DIAGNOSIS — M546 Pain in thoracic spine: Secondary | ICD-10-CM | POA: Diagnosis not present

## 2017-02-23 ENCOUNTER — Other Ambulatory Visit: Payer: Self-pay | Admitting: Obstetrics & Gynecology

## 2017-04-10 DIAGNOSIS — J029 Acute pharyngitis, unspecified: Secondary | ICD-10-CM | POA: Diagnosis not present

## 2017-04-10 DIAGNOSIS — Z6821 Body mass index (BMI) 21.0-21.9, adult: Secondary | ICD-10-CM | POA: Diagnosis not present

## 2018-04-17 DIAGNOSIS — L709 Acne, unspecified: Secondary | ICD-10-CM | POA: Diagnosis not present

## 2018-04-17 DIAGNOSIS — D229 Melanocytic nevi, unspecified: Secondary | ICD-10-CM | POA: Diagnosis not present

## 2020-04-01 ENCOUNTER — Other Ambulatory Visit: Payer: Self-pay

## 2020-04-01 ENCOUNTER — Ambulatory Visit (INDEPENDENT_AMBULATORY_CARE_PROVIDER_SITE_OTHER): Payer: No Typology Code available for payment source | Admitting: Obstetrics & Gynecology

## 2020-04-01 ENCOUNTER — Encounter: Payer: Self-pay | Admitting: Obstetrics & Gynecology

## 2020-04-01 VITALS — BP 123/85 | HR 81 | Ht 63.0 in | Wt 114.0 lb

## 2020-04-01 DIAGNOSIS — Z3009 Encounter for other general counseling and advice on contraception: Secondary | ICD-10-CM

## 2020-04-01 NOTE — Progress Notes (Signed)
Chief Complaint  Patient presents with  . Contraception      30 y.o. G0P0000 No LMP recorded. The current method of family planning is none.  Outpatient Encounter Medications as of 04/01/2020  Medication Sig  . [DISCONTINUED] ondansetron (ZOFRAN) 4 MG tablet Take 1 tablet (4 mg total) by mouth every 8 (eight) hours as needed for nausea or vomiting.  . [DISCONTINUED] VIORELE 0.15-0.02/0.01 MG (21/5) tablet TAKE 1 TABLET BY MOUTH EVERY DAY   No facility-administered encounter medications on file as of 04/01/2020.    Subjective Pt with history of significant negative reaction to COC, hot flushes menopausal type symtpoms would anticipate progesterone only methods to worsen these bothersome symptoms Discussed depo nexplanon paragard mirena nuva ring ortho evra Only reasonable option would be paragard but patient does not want to invest that much time in to it at thsi point  Also discussed ovulation induction if needed after she gets married in April She will keep track and circle back to me if she is too irregular will consider clomid History reviewed. No pertinent past medical history.  History reviewed. No pertinent surgical history.  OB History    Gravida  0   Para  0   Term  0   Preterm  0   AB  0   Living  0     SAB  0   IAB  0   Ectopic  0   Multiple  0   Live Births  0           No Known Allergies  Social History   Socioeconomic History  . Marital status: Single    Spouse name: Not on file  . Number of children: Not on file  . Years of education: Not on file  . Highest education level: Not on file  Occupational History  . Not on file  Tobacco Use  . Smoking status: Never Smoker  . Smokeless tobacco: Never Used  Substance and Sexual Activity  . Alcohol use: No  . Drug use: No  . Sexual activity: Never  Other Topics Concern  . Not on file  Social History Narrative  . Not on file   Social Determinants of Health   Financial  Resource Strain: Not on file  Food Insecurity: Not on file  Transportation Needs: Not on file  Physical Activity: Not on file  Stress: Not on file  Social Connections: Not on file    Family History  Problem Relation Age of Onset  . Diabetes Paternal Grandfather   . Heart Problems Paternal Grandfather   . Diabetes Paternal Grandmother   . Lung cancer Maternal Grandfather     Medications:      No current outpatient medications on file.  Objective Blood pressure 123/85, pulse 81, height 5\' 3"  (1.6 m), weight 114 lb (51.7 kg).  Gen WDWN NAD  Pertinent ROS No burning with urination, frequency or urgency No nausea, vomiting or diarrhea Nor fever chills or other constitutional symptoms   Labs or studies     Impression Diagnoses this Encounter::   ICD-10-CM   1. Birth control counseling  Z30.09     Established relevant diagnosis(es):   Plan/Recommendations: No orders of the defined types were placed in this encounter.   Labs or Scans Ordered: No orders of the defined types were placed in this encounter.   Management:: Recommend barrier methods for [facility] given her previous symptoms with COC  Follow up Return if symptoms worsen or  fail to improve.        Face to face time:  10 minutes  Greater than 50% of the visit time was spent in counseling and coordination of care with the patient.  The summary and outline of the counseling and care coordination is summarized in the note above.   All questions were answered.

## 2020-08-19 ENCOUNTER — Telehealth: Payer: No Typology Code available for payment source | Admitting: Orthopedic Surgery

## 2020-08-19 DIAGNOSIS — J310 Chronic rhinitis: Secondary | ICD-10-CM | POA: Diagnosis not present

## 2020-08-19 MED ORDER — FLUTICASONE PROPIONATE 50 MCG/ACT NA SUSP: 2.0000 | Freq: Every day | NASAL | 0 refills | Status: AC

## 2020-08-19 MED ORDER — PREDNISONE 20 MG PO TABS: 40.0000 mg | ORAL_TABLET | Freq: Every day | ORAL | 0 refills | Status: AC

## 2020-08-19 NOTE — Progress Notes (Signed)
E visit for Allergic Rhinitis We are sorry that you are not feeling well.  Here is how we plan to help!  Based on what you have shared with me it looks like you have Allergic Rhinitis.  Rhinitis is when a reaction occurs that causes nasal congestion, runny nose, sneezing, and itching.  Most types of rhinitis are caused by an inflammation and are associated with symptoms in the eyes ears or throat. There are several types of rhinitis.  The most common are acute rhinitis, which is usually caused by a viral illness, allergic or seasonal rhinitis, and nonallergic or year-round rhinitis.  Nasal allergies occur certain times of the year.  Allergic rhinitis is caused when allergens in the air trigger the release of histamine in the body.  Histamine causes itching, swelling, and fluid to build up in the fragile linings of the nasal passages, sinuses and eyelids.  An itchy nose and clear discharge are common.  I will prescribe a prednisone burst, 40mg  daily for 3 days. If this is allergies it should help dramatically.  I also would recommend a nasal spray: Flonase 2 sprays into each nostril once daily    HOME CARE:   You can use an over-the-counter saline nasal spray as needed  Avoid areas where there is heavy dust, mites, or molds  Stay indoors on windy days during the pollen season  Keep windows closed in home, at least in bedroom; use air conditioner.  Use high-efficiency house air filter  Keep windows closed in car, turn AC on re-circulate  Avoid playing out with dog during pollen season  GET HELP RIGHT AWAY IF:   If your symptoms do not improve within 10 days  You become short of breath  You develop yellow or green discharge from your nose for over 3 days  You have coughing fits  MAKE SURE YOU:   Understand these instructions  Will watch your condition  Will get help right away if you are not doing well or get worse  Thank you for choosing an e-visit. Your e-visit  answers were reviewed by a board certified advanced clinical practitioner to complete your personal care plan. Depending upon the condition, your plan could have included both over the counter or prescription medications. Please review your pharmacy choice. Be sure that the pharmacy you have chosen is open so that you can pick up your prescription now.  If there is a problem you may message your provider in [place] to have the prescription routed to another pharmacy. Your safety is important to [place]. If you have drug allergies check your prescription carefully.  For the next 24 hours, you can use MyChart to ask questions about today's visit, request a non-urgent call back, or ask for a work or school excuse from your e-visit provider. You will get an email in the next two days asking about your experience. I hope that your e-visit has been valuable and will speed your recovery.   Greater than 5 minutes, yet less than 10 minutes of time have been spent researching, coordinating and implementing care for this patient today.
